# Patient Record
Sex: Male | Born: 1935 | Race: White | Hispanic: No | Marital: Married
Health system: Southern US, Community
[De-identification: ages and names within clinical notes are randomized; demographics above are authoritative.]

---

## 2003-09-14 ENCOUNTER — Other Ambulatory Visit: Payer: Self-pay

## 2004-05-12 ENCOUNTER — Other Ambulatory Visit: Payer: Self-pay

## 2004-05-13 ENCOUNTER — Inpatient Hospital Stay: Payer: Self-pay | Admitting: Internal Medicine

## 2004-05-18 ENCOUNTER — Ambulatory Visit: Payer: Self-pay | Admitting: Physical Medicine & Rehabilitation

## 2004-05-18 ENCOUNTER — Inpatient Hospital Stay (HOSPITAL_COMMUNITY)
Admission: RE | Admit: 2004-05-18 | Discharge: 2004-05-30 | Payer: Self-pay | Admitting: Physical Medicine & Rehabilitation

## 2004-06-26 ENCOUNTER — Encounter: Payer: Self-pay | Admitting: Internal Medicine

## 2004-06-29 ENCOUNTER — Encounter
Admission: RE | Admit: 2004-06-29 | Discharge: 2004-09-27 | Payer: Self-pay | Admitting: Physical Medicine & Rehabilitation

## 2004-07-01 ENCOUNTER — Encounter: Payer: Self-pay | Admitting: Internal Medicine

## 2004-07-03 ENCOUNTER — Ambulatory Visit: Payer: Self-pay | Admitting: Physical Medicine & Rehabilitation

## 2004-07-31 ENCOUNTER — Encounter: Payer: Self-pay | Admitting: Internal Medicine

## 2004-08-31 ENCOUNTER — Encounter: Payer: Self-pay | Admitting: Internal Medicine

## 2004-10-07 ENCOUNTER — Emergency Department: Payer: Self-pay | Admitting: Emergency Medicine

## 2004-10-07 ENCOUNTER — Other Ambulatory Visit: Payer: Self-pay

## 2004-10-18 ENCOUNTER — Encounter: Payer: Self-pay | Admitting: Internal Medicine

## 2005-06-24 ENCOUNTER — Emergency Department: Payer: Self-pay | Admitting: Emergency Medicine

## 2005-11-13 ENCOUNTER — Ambulatory Visit: Payer: Self-pay | Admitting: Urology

## 2008-06-17 ENCOUNTER — Ambulatory Visit: Payer: Self-pay | Admitting: Ophthalmology

## 2008-06-28 ENCOUNTER — Ambulatory Visit: Payer: Self-pay | Admitting: Ophthalmology

## 2010-03-02 ENCOUNTER — Ambulatory Visit: Payer: Self-pay | Admitting: Internal Medicine

## 2010-03-07 ENCOUNTER — Inpatient Hospital Stay: Payer: Self-pay | Admitting: Internal Medicine

## 2010-03-14 ENCOUNTER — Encounter: Payer: Self-pay | Admitting: Internal Medicine

## 2010-04-02 ENCOUNTER — Ambulatory Visit: Payer: Self-pay | Admitting: Internal Medicine

## 2010-04-26 ENCOUNTER — Emergency Department: Payer: Self-pay | Admitting: Urology

## 2010-05-12 ENCOUNTER — Ambulatory Visit (HOSPITAL_BASED_OUTPATIENT_CLINIC_OR_DEPARTMENT_OTHER)
Admission: RE | Admit: 2010-05-12 | Discharge: 2010-05-13 | Disposition: A | Discharge status: Home / Self Care | Payer: Medicare Other | Source: Ambulatory Visit | Attending: Urology | Admitting: Urology

## 2010-05-12 ENCOUNTER — Other Ambulatory Visit: Payer: Self-pay | Admitting: Urology

## 2010-05-12 DIAGNOSIS — Z79899 Other long term (current) drug therapy: Secondary | ICD-10-CM | POA: Insufficient documentation

## 2010-05-12 DIAGNOSIS — N401 Enlarged prostate with lower urinary tract symptoms: Secondary | ICD-10-CM | POA: Insufficient documentation

## 2010-05-12 DIAGNOSIS — N320 Bladder-neck obstruction: Secondary | ICD-10-CM | POA: Insufficient documentation

## 2010-05-12 DIAGNOSIS — R310 Gross hematuria: Secondary | ICD-10-CM | POA: Insufficient documentation

## 2010-05-12 DIAGNOSIS — R339 Retention of urine, unspecified: Secondary | ICD-10-CM | POA: Insufficient documentation

## 2010-05-12 DIAGNOSIS — N138 Other obstructive and reflux uropathy: Secondary | ICD-10-CM | POA: Insufficient documentation

## 2010-05-12 DIAGNOSIS — Z01812 Encounter for preprocedural laboratory examination: Secondary | ICD-10-CM | POA: Insufficient documentation

## 2010-05-12 LAB — COMPREHENSIVE METABOLIC PANEL
BUN: 7 mg/dL (ref 6–23)
Chloride: 108 mEq/L (ref 96–112)
Creatinine, Ser: 0.77 mg/dL (ref 0.4–1.5)
GFR calc Af Amer: >60 mL/min (ref >60)
GFR calc non Af Amer: >60 mL/min (ref >60)
Glucose, Bld: 123 mg/dL — ABNORMAL HIGH (ref 70–99)
Potassium: 3.8 mEq/L (ref 3.5–5.1)
Sodium: 143 mEq/L (ref 135–145)

## 2010-05-12 LAB — URINALYSIS, ROUTINE W REFLEX MICROSCOPIC
Bilirubin Urine: NEGATIVE
Nitrite: NEGATIVE
Protein, ur: 100 mg/dL — AB
Specific Gravity, Urine: 1.018 (ref 1.005–1.030)
Urine Glucose, Fasting: NEGATIVE mg/dL
pH: 8 (ref 5.0–8.0)

## 2010-05-12 LAB — CBC
HCT: 39.5 % (ref 39.0–52.0)
Platelets: 145 10*3/uL — ABNORMAL LOW (ref 150–400)
RDW: 15.8 % — ABNORMAL HIGH (ref 11.5–15.5)
WBC: 6.6 10*3/uL (ref 4.0–10.5)

## 2010-05-12 LAB — URINE MICROSCOPIC-ADD ON

## 2010-05-13 ENCOUNTER — Emergency Department (HOSPITAL_COMMUNITY)
Admission: EM | Admit: 2010-05-13 | Discharge: 2010-05-13 | Disposition: A | Discharge status: Home / Self Care | Payer: Medicare Other | Attending: Emergency Medicine | Admitting: Emergency Medicine

## 2010-05-13 DIAGNOSIS — I10 Essential (primary) hypertension: Secondary | ICD-10-CM | POA: Insufficient documentation

## 2010-05-13 DIAGNOSIS — R339 Retention of urine, unspecified: Secondary | ICD-10-CM | POA: Insufficient documentation

## 2010-05-13 DIAGNOSIS — Z9889 Other specified postprocedural states: Secondary | ICD-10-CM | POA: Insufficient documentation

## 2010-05-13 DIAGNOSIS — N390 Urinary tract infection, site not specified: Secondary | ICD-10-CM | POA: Insufficient documentation

## 2010-05-13 LAB — URINALYSIS, ROUTINE W REFLEX MICROSCOPIC
Bilirubin Urine: NEGATIVE
Ketones, ur: NEGATIVE mg/dL
Nitrite: NEGATIVE
Protein, ur: 100 mg/dL — AB
Specific Gravity, Urine: 1.01 (ref 1.005–1.030)

## 2010-05-13 LAB — URINE MICROSCOPIC-ADD ON

## 2010-05-15 LAB — URINE CULTURE
Colony Count: >=100000
Culture  Setup Time: 201202101405
Culture  Setup Time: 201202120153
Culture: NO GROWTH

## 2010-05-23 NOTE — Op Note (Signed)
  Derek, [name]                ACCOUNT NO.:  [id number]  MEDICAL RECORD NO.:  [id number]           PATIENT TYPE:  LOCATION:                                 FACILITY:  PHYSICIAN:  Danae Chen, M.D.       DATE OF BIRTH:  DATE OF PROCEDURE:  05/12/2010 DATE OF DISCHARGE:                              OPERATIVE REPORT   PREOPERATIVE DIAGNOSES: 1. Acute urinary retention. 2. Benign prostatic hypertrophy.  POSTOPERATIVE DIAGNOSES: 1. Acute urinary retention. 2. Benign prostatic hypertrophy.  PROCEDURES DONE: 1. Cystoscopy. 2. Transurethral resection of prostate.  SURGEON:  Danae Chen, MD  ANESTHESIA:  General.  INDICATIONS:  The patient is an 77 year old male who went into urinary retention in December 2011.  He also had gross hematuria.  A Foley catheter was left indwelling.  He failed about 5 voiding trials.  He is on tamsulosin.  Cystoscopy showed trilobar prostatic hypertrophy.  CT scan showed normal kidneys and thickened bladder wall.  The patient is scheduled today for cystoscopy and TURP.  DESCRIPTION OF PROCEDURE:  The patient was identified by his wristband, and proper time-out was taken.  Under general anesthesia, he was prepped and draped and placed in the dorsal lithotomy position.  A panendoscope was inserted in the bladder. The anterior urethra is normal.  He has trilobar prostatic hypertrophy with obstruction of the bladder neck.  The bladder is trabeculated. There is a diverticulum on the posterior wall of the bladder.  There is no stone or tumor in the bladder nor in the diverticulum.  The ureteral orifices are in normal position and shape.  The cystoscope was removed. The urethra was dilated up to #30- [place], then a #28 resectoscope was inserted in the bladder.  Resection of the prostate was started between the 5 and the 7 o'clock position using the bladder neck and the verumontanum as landmarks.  Then, resection was done between the 7 and 10  o'clock positions and between the 2 and 5 o'clock positions using the same landmarks.  The resection was then completed between the 10 and the 2 o'clock positions.  Hemostasis was secured with electrocautery.  There was minimal bleeding at the end of the procedure.  The prostatic chips were irrigated out of the bladder. Then, the resectoscope was removed, and a #24 Foley catheter was inserted in the bladder.  The patient tolerated the procedure well and left the OR in satisfactory condition to postanesthesia care unit.     Danae Chen, M.D.     MN/MEDQ  D:  05/12/2010  T:  05/12/2010  Job:  [id number]  Electronically Signed by Lindaann Slough M.D. on 05/23/2010 11:35:29 AM

## 2010-07-19 ENCOUNTER — Encounter: Payer: Self-pay | Admitting: Internal Medicine

## 2010-08-01 ENCOUNTER — Encounter: Payer: Self-pay | Admitting: Internal Medicine

## 2010-09-01 ENCOUNTER — Encounter: Payer: Self-pay | Admitting: Internal Medicine

## 2011-06-07 ENCOUNTER — Encounter: Payer: Self-pay | Admitting: Internal Medicine

## 2011-07-02 ENCOUNTER — Encounter: Payer: Self-pay | Admitting: Internal Medicine

## 2011-08-01 ENCOUNTER — Encounter: Payer: Self-pay | Admitting: Internal Medicine

## 2012-06-23 ENCOUNTER — Inpatient Hospital Stay: Payer: Self-pay | Admitting: Orthopedic Surgery

## 2012-06-23 LAB — COMPREHENSIVE METABOLIC PANEL
Alkaline Phosphatase: 112 U/L (ref 50–136)
Anion Gap: 2 — ABNORMAL LOW (ref 7–16)
Bilirubin,Total: 1.3 mg/dL — ABNORMAL HIGH (ref 0.2–1.0)
Co2: 33 mmol/L — ABNORMAL HIGH (ref 21–32)
Creatinine: 0.95 mg/dL (ref 0.60–1.30)
EGFR (Non-African Amer.): >60
Potassium: 3.8 mmol/L (ref 3.5–5.1)

## 2012-06-23 LAB — CBC WITH DIFFERENTIAL/PLATELET
Basophil %: 0.3 %
Eosinophil #: 0 10*3/uL (ref 0.0–0.7)
Eosinophil %: 0.3 %
HCT: 49.3 % (ref 40.0–52.0)
Lymphocyte #: 2.4 10*3/uL (ref 1.0–3.6)
MCHC: 33.5 g/dL (ref 32.0–36.0)
Monocyte #: 1 x10 3/mm (ref 0.2–1.0)
Monocyte %: 8.5 %
Neutrophil #: 7.9 10*3/uL — ABNORMAL HIGH (ref 1.4–6.5)
RBC: 5.02 10*6/uL (ref 4.40–5.90)
RDW: 15.2 % — ABNORMAL HIGH (ref 11.5–14.5)
WBC: 11.4 10*3/uL — ABNORMAL HIGH (ref 3.8–10.6)

## 2012-06-23 LAB — PROTIME-INR
INR: 2.2
Prothrombin Time: 24.2 secs — ABNORMAL HIGH (ref 11.5–14.7)

## 2012-06-24 LAB — PROTIME-INR
INR: 1.4
Prothrombin Time: 17.1 secs — ABNORMAL HIGH (ref 11.5–14.7)

## 2012-06-24 LAB — BASIC METABOLIC PANEL
Anion Gap: 4 — ABNORMAL LOW (ref 7–16)
Chloride: 108 mmol/L — ABNORMAL HIGH (ref 98–107)
Creatinine: 0.92 mg/dL (ref 0.60–1.30)
EGFR (African American): >60
Glucose: 150 mg/dL — ABNORMAL HIGH (ref 65–99)

## 2012-06-24 LAB — CBC WITH DIFFERENTIAL/PLATELET
Basophil #: 0 10*3/uL (ref 0.0–0.1)
Basophil %: 0.3 %
HGB: 13.8 g/dL (ref 13.0–18.0)
Lymphocyte #: 1.9 10*3/uL (ref 1.0–3.6)
Lymphocyte %: 19 %
Monocyte %: 8.1 %
Neutrophil #: 7.2 10*3/uL — ABNORMAL HIGH (ref 1.4–6.5)
Neutrophil %: 71.2 %
WBC: 10.2 10*3/uL (ref 3.8–10.6)

## 2012-06-24 LAB — MAGNESIUM: Magnesium: 1.8 mg/dL

## 2012-06-25 LAB — PROTIME-INR: Prothrombin Time: 16.1 secs — ABNORMAL HIGH (ref 11.5–14.7)

## 2012-06-26 LAB — BASIC METABOLIC PANEL
Anion Gap: 4 — ABNORMAL LOW (ref 7–16)
Chloride: 104 mmol/L (ref 98–107)
Co2: 31 mmol/L (ref 21–32)
EGFR (African American): >60

## 2012-06-26 LAB — CBC WITH DIFFERENTIAL/PLATELET
Basophil #: 0 10*3/uL (ref 0.0–0.1)
Eosinophil #: 0.5 10*3/uL (ref 0.0–0.7)
Eosinophil %: 4.7 %
HGB: 12 g/dL — ABNORMAL LOW (ref 13.0–18.0)
Lymphocyte #: 1.7 10*3/uL (ref 1.0–3.6)
MCH: 32.9 pg (ref 26.0–34.0)
MCHC: 33.3 g/dL (ref 32.0–36.0)
MCV: 99 fL (ref 80–100)
Monocyte #: 1.6 x10 3/mm — ABNORMAL HIGH (ref 0.2–1.0)
Monocyte %: 15.8 %
Neutrophil %: 62.6 %
RBC: 3.65 10*6/uL — ABNORMAL LOW (ref 4.40–5.90)
RDW: 14.6 % — ABNORMAL HIGH (ref 11.5–14.5)

## 2012-06-27 LAB — BASIC METABOLIC PANEL
Creatinine: 1.04 mg/dL (ref 0.60–1.30)
Osmolality: 285 (ref 275–301)
Potassium: 3.9 mmol/L (ref 3.5–5.1)
Sodium: 141 mmol/L (ref 136–145)

## 2012-06-28 LAB — URINALYSIS, COMPLETE
Bacteria: NONE SEEN
Bilirubin,UR: NEGATIVE
Glucose,UR: NEGATIVE mg/dL (ref 0–75)
Hyaline Cast: 1
Leukocyte Esterase: NEGATIVE
RBC,UR: 2 /HPF (ref 0–5)
Specific Gravity: 1.017 (ref 1.003–1.030)

## 2012-06-28 LAB — BASIC METABOLIC PANEL
Anion Gap: 4 — ABNORMAL LOW (ref 7–16)
Calcium, Total: 8.5 mg/dL (ref 8.5–10.1)
Creatinine: 1.13 mg/dL (ref 0.60–1.30)
EGFR (Non-African Amer.): 59 — ABNORMAL LOW
Potassium: 4 mmol/L (ref 3.5–5.1)

## 2012-06-28 LAB — CBC WITH DIFFERENTIAL/PLATELET
HGB: 11.3 g/dL — ABNORMAL LOW (ref 13.0–18.0)
MCH: 33.8 pg (ref 26.0–34.0)
MCHC: 34 g/dL (ref 32.0–36.0)
Monocyte #: 1.5 x10 3/mm — ABNORMAL HIGH (ref 0.2–1.0)
Monocyte %: 15 %
WBC: 9.7 10*3/uL (ref 3.8–10.6)

## 2012-06-29 LAB — BASIC METABOLIC PANEL
BUN: 25 mg/dL — ABNORMAL HIGH (ref 7–18)
Calcium, Total: 8.1 mg/dL — ABNORMAL LOW (ref 8.5–10.1)
Co2: 32 mmol/L (ref 21–32)
EGFR (Non-African Amer.): >60
Potassium: 3.4 mmol/L — ABNORMAL LOW (ref 3.5–5.1)
Sodium: 144 mmol/L (ref 136–145)

## 2012-06-30 ENCOUNTER — Encounter: Payer: Self-pay | Admitting: Internal Medicine

## 2012-07-01 ENCOUNTER — Encounter: Payer: Self-pay | Admitting: Internal Medicine

## 2012-07-03 LAB — PROTIME-INR
INR: 1.2
Prothrombin Time: 15.1 secs — ABNORMAL HIGH (ref 11.5–14.7)

## 2012-07-15 LAB — PROTIME-INR
INR: 2.3
Prothrombin Time: 24.6 secs — ABNORMAL HIGH (ref 11.5–14.7)

## 2012-07-17 LAB — PROTIME-INR: Prothrombin Time: 21.2 secs — ABNORMAL HIGH (ref 11.5–14.7)

## 2012-07-22 LAB — PROTIME-INR: INR: 3

## 2012-07-29 LAB — PROTIME-INR: Prothrombin Time: 35.1 secs — ABNORMAL HIGH (ref 11.5–14.7)

## 2012-07-31 ENCOUNTER — Encounter: Payer: Self-pay | Admitting: Internal Medicine

## 2013-08-31 ENCOUNTER — Encounter: Payer: Self-pay | Admitting: Internal Medicine

## 2013-09-16 ENCOUNTER — Inpatient Hospital Stay: Payer: Self-pay | Admitting: Specialist

## 2013-09-16 LAB — COMPREHENSIVE METABOLIC PANEL
ALK PHOS: 110 U/L
ANION GAP: 6 — AB (ref 7–16)
Albumin: 3.6 g/dL (ref 3.4–5.0)
BILIRUBIN TOTAL: 1.2 mg/dL — AB (ref 0.2–1.0)
BUN: 16 mg/dL (ref 7–18)
CO2: 31 mmol/L (ref 21–32)
Calcium, Total: 8.9 mg/dL (ref 8.5–10.1)
Chloride: 104 mmol/L (ref 98–107)
Creatinine: 0.93 mg/dL (ref 0.60–1.30)
EGFR (Non-African Amer.): >60
Glucose: 109 mg/dL — ABNORMAL HIGH (ref 65–99)
Osmolality: 283 (ref 275–301)
Potassium: 3.9 mmol/L (ref 3.5–5.1)
SGOT(AST): 45 U/L — ABNORMAL HIGH (ref 15–37)
SGPT (ALT): 22 U/L (ref 12–78)
Sodium: 141 mmol/L (ref 136–145)
TOTAL PROTEIN: 7 g/dL (ref 6.4–8.2)

## 2013-09-16 LAB — URINALYSIS, COMPLETE
BACTERIA: NONE SEEN
Bilirubin,UR: NEGATIVE
GLUCOSE, UR: NEGATIVE mg/dL (ref 0–75)
Ketone: NEGATIVE
Leukocyte Esterase: NEGATIVE
Nitrite: NEGATIVE
PH: 6 (ref 4.5–8.0)
PROTEIN: NEGATIVE
RBC,UR: 1 /HPF (ref 0–5)
SPECIFIC GRAVITY: 1.006 (ref 1.003–1.030)
Squamous Epithelial: NONE SEEN

## 2013-09-16 LAB — PROTIME-INR
INR: 2.3
PROTHROMBIN TIME: 25 s — AB (ref 11.5–14.7)

## 2013-09-16 LAB — CBC
HCT: 43.2 % (ref 40.0–52.0)
HGB: 14.3 g/dL (ref 13.0–18.0)
MCH: 33.3 pg (ref 26.0–34.0)
MCHC: 33.2 g/dL (ref 32.0–36.0)
MCV: 100 fL (ref 80–100)
PLATELETS: 139 10*3/uL — AB (ref 150–440)
RBC: 4.31 10*6/uL — ABNORMAL LOW (ref 4.40–5.90)
RDW: 15.6 % — AB (ref 11.5–14.5)
WBC: 9.1 10*3/uL (ref 3.8–10.6)

## 2013-09-17 LAB — BASIC METABOLIC PANEL
Anion Gap: 4 — ABNORMAL LOW (ref 7–16)
BUN: 16 mg/dL (ref 7–18)
CREATININE: 0.91 mg/dL (ref 0.60–1.30)
Calcium, Total: 8.8 mg/dL (ref 8.5–10.1)
Chloride: 104 mmol/L (ref 98–107)
Co2: 34 mmol/L — ABNORMAL HIGH (ref 21–32)
EGFR (African American): >60
GLUCOSE: 131 mg/dL — AB (ref 65–99)
Osmolality: 286 (ref 275–301)
POTASSIUM: 4.4 mmol/L (ref 3.5–5.1)
Sodium: 142 mmol/L (ref 136–145)

## 2013-09-17 LAB — CBC WITH DIFFERENTIAL/PLATELET
BASOS ABS: 0 10*3/uL (ref 0.0–0.1)
Basophil %: 0.4 %
Eosinophil #: 0 10*3/uL (ref 0.0–0.7)
Eosinophil %: 0.5 %
HCT: 37.1 % — AB (ref 40.0–52.0)
HGB: 12.4 g/dL — ABNORMAL LOW (ref 13.0–18.0)
Lymphocyte #: 2.3 10*3/uL (ref 1.0–3.6)
Lymphocyte %: 24.6 %
MCH: 33.4 pg (ref 26.0–34.0)
MCHC: 33.6 g/dL (ref 32.0–36.0)
MCV: 100 fL (ref 80–100)
MONO ABS: 1.3 x10 3/mm — AB (ref 0.2–1.0)
MONOS PCT: 14.3 %
Neutrophil #: 5.6 10*3/uL (ref 1.4–6.5)
Neutrophil %: 60.2 %
Platelet: 135 10*3/uL — ABNORMAL LOW (ref 150–440)
RBC: 3.73 10*6/uL — ABNORMAL LOW (ref 4.40–5.90)
RDW: 15.8 % — ABNORMAL HIGH (ref 11.5–14.5)
WBC: 9.3 10*3/uL (ref 3.8–10.6)

## 2013-09-17 LAB — PROTIME-INR
INR: 1.6
Prothrombin Time: 18.5 secs — ABNORMAL HIGH (ref 11.5–14.7)

## 2013-09-18 LAB — BASIC METABOLIC PANEL
ANION GAP: 4 — AB (ref 7–16)
BUN: 17 mg/dL (ref 7–18)
Calcium, Total: 8.9 mg/dL (ref 8.5–10.1)
Chloride: 105 mmol/L (ref 98–107)
Co2: 32 mmol/L (ref 21–32)
Creatinine: 1.01 mg/dL (ref 0.60–1.30)
EGFR (African American): >60
Glucose: 115 mg/dL — ABNORMAL HIGH (ref 65–99)
Osmolality: 284 (ref 275–301)
POTASSIUM: 3.6 mmol/L (ref 3.5–5.1)
SODIUM: 141 mmol/L (ref 136–145)

## 2013-09-18 LAB — CBC WITH DIFFERENTIAL/PLATELET
BASOS ABS: 0 10*3/uL (ref 0.0–0.1)
Basophil %: 0.4 %
Eosinophil #: 0.4 10*3/uL (ref 0.0–0.7)
Eosinophil %: 5 %
HCT: 34.7 % — AB (ref 40.0–52.0)
HGB: 11.5 g/dL — ABNORMAL LOW (ref 13.0–18.0)
LYMPHS ABS: 2.2 10*3/uL (ref 1.0–3.6)
LYMPHS PCT: 25.2 %
MCH: 33.3 pg (ref 26.0–34.0)
MCHC: 33 g/dL (ref 32.0–36.0)
MCV: 101 fL — ABNORMAL HIGH (ref 80–100)
Monocyte #: 1.4 x10 3/mm — ABNORMAL HIGH (ref 0.2–1.0)
Monocyte %: 15.8 %
Neutrophil #: 4.8 10*3/uL (ref 1.4–6.5)
Neutrophil %: 53.6 %
PLATELETS: 113 10*3/uL — AB (ref 150–440)
RBC: 3.44 10*6/uL — ABNORMAL LOW (ref 4.40–5.90)
RDW: 16.1 % — ABNORMAL HIGH (ref 11.5–14.5)
WBC: 8.9 10*3/uL (ref 3.8–10.6)

## 2013-09-18 LAB — PROTIME-INR
INR: 1.2
Prothrombin Time: 15.4 secs — ABNORMAL HIGH (ref 11.5–14.7)

## 2013-09-19 LAB — BASIC METABOLIC PANEL
ANION GAP: 3 — AB (ref 7–16)
BUN: 13 mg/dL (ref 7–18)
Calcium, Total: 8.2 mg/dL — ABNORMAL LOW (ref 8.5–10.1)
Chloride: 104 mmol/L (ref 98–107)
Co2: 33 mmol/L — ABNORMAL HIGH (ref 21–32)
Creatinine: 0.95 mg/dL (ref 0.60–1.30)
EGFR (African American): >60
EGFR (Non-African Amer.): >60
Glucose: 164 mg/dL — ABNORMAL HIGH (ref 65–99)
Osmolality: 283 (ref 275–301)
POTASSIUM: 3.3 mmol/L — AB (ref 3.5–5.1)
Sodium: 140 mmol/L (ref 136–145)

## 2013-09-19 LAB — CBC WITH DIFFERENTIAL/PLATELET
BASOS PCT: 0.6 %
Basophil #: 0.1 10*3/uL (ref 0.0–0.1)
EOS PCT: 2.4 %
Eosinophil #: 0.2 10*3/uL (ref 0.0–0.7)
HCT: 28.3 % — AB (ref 40.0–52.0)
HGB: 9.6 g/dL — AB (ref 13.0–18.0)
Lymphocyte #: 1.5 10*3/uL (ref 1.0–3.6)
Lymphocyte %: 16.8 %
MCH: 33.8 pg (ref 26.0–34.0)
MCHC: 34.1 g/dL (ref 32.0–36.0)
MCV: 99 fL (ref 80–100)
MONOS PCT: 17.4 %
Monocyte #: 1.6 x10 3/mm — ABNORMAL HIGH (ref 0.2–1.0)
NEUTROS PCT: 62.8 %
Neutrophil #: 5.8 10*3/uL (ref 1.4–6.5)
Platelet: 114 10*3/uL — ABNORMAL LOW (ref 150–440)
RBC: 2.85 10*6/uL — ABNORMAL LOW (ref 4.40–5.90)
RDW: 15.1 % — ABNORMAL HIGH (ref 11.5–14.5)
WBC: 9.2 10*3/uL (ref 3.8–10.6)

## 2013-09-19 LAB — PROTIME-INR
INR: 1.4
Prothrombin Time: 16.7 secs — ABNORMAL HIGH (ref 11.5–14.7)

## 2013-09-20 LAB — CBC WITH DIFFERENTIAL/PLATELET
Comment - H1-Com2: NORMAL
HCT: 26.6 % — ABNORMAL LOW (ref 40.0–52.0)
HGB: 8.8 g/dL — ABNORMAL LOW (ref 13.0–18.0)
LYMPHS PCT: 21 %
MCH: 32.9 pg (ref 26.0–34.0)
MCHC: 33 g/dL (ref 32.0–36.0)
MCV: 100 fL (ref 80–100)
Monocytes: 9 %
PLATELETS: 108 10*3/uL — AB (ref 150–440)
RBC: 2.66 10*6/uL — AB (ref 4.40–5.90)
RDW: 15.1 % — AB (ref 11.5–14.5)
Segmented Neutrophils: 70 %
WBC: 10.9 10*3/uL — ABNORMAL HIGH (ref 3.8–10.6)

## 2013-09-20 LAB — BASIC METABOLIC PANEL
Anion Gap: 6 — ABNORMAL LOW (ref 7–16)
BUN: 18 mg/dL (ref 7–18)
CALCIUM: 8.3 mg/dL — AB (ref 8.5–10.1)
CO2: 32 mmol/L (ref 21–32)
Chloride: 103 mmol/L (ref 98–107)
Creatinine: 1.23 mg/dL (ref 0.60–1.30)
EGFR (African American): >60
GFR CALC NON AF AMER: 52 — AB
Glucose: 122 mg/dL — ABNORMAL HIGH (ref 65–99)
OSMOLALITY: 284 (ref 275–301)
Potassium: 3.2 mmol/L — ABNORMAL LOW (ref 3.5–5.1)
SODIUM: 141 mmol/L (ref 136–145)

## 2013-09-20 LAB — PROTIME-INR
INR: 1.7
Prothrombin Time: 19.6 secs — ABNORMAL HIGH (ref 11.5–14.7)

## 2013-09-21 LAB — BASIC METABOLIC PANEL
Anion Gap: 6 — ABNORMAL LOW (ref 7–16)
BUN: 18 mg/dL (ref 7–18)
Calcium, Total: 8.1 mg/dL — ABNORMAL LOW (ref 8.5–10.1)
Chloride: 102 mmol/L (ref 98–107)
Co2: 34 mmol/L — ABNORMAL HIGH (ref 21–32)
Creatinine: 1.06 mg/dL (ref 0.60–1.30)
EGFR (African American): >60
EGFR (Non-African Amer.): >60
Glucose: 134 mg/dL — ABNORMAL HIGH (ref 65–99)
OSMOLALITY: 287 (ref 275–301)
Potassium: 3.3 mmol/L — ABNORMAL LOW (ref 3.5–5.1)
SODIUM: 142 mmol/L (ref 136–145)

## 2013-09-21 LAB — PROTIME-INR
INR: 2
PROTHROMBIN TIME: 22.3 s — AB (ref 11.5–14.7)

## 2013-09-21 LAB — HEMOGLOBIN: HGB: 8.7 g/dL — ABNORMAL LOW (ref 13.0–18.0)

## 2013-09-22 ENCOUNTER — Encounter: Payer: Self-pay | Admitting: Internal Medicine

## 2013-09-22 LAB — BASIC METABOLIC PANEL
Anion Gap: 5 — ABNORMAL LOW (ref 7–16)
BUN: 23 mg/dL — AB (ref 7–18)
CALCIUM: 9.3 mg/dL (ref 8.5–10.1)
Chloride: 105 mmol/L (ref 98–107)
Co2: 34 mmol/L — ABNORMAL HIGH (ref 21–32)
Creatinine: 1 mg/dL (ref 0.60–1.30)
EGFR (African American): >60
GLUCOSE: 118 mg/dL — AB (ref 65–99)
Osmolality: 292 (ref 275–301)
Potassium: 3.8 mmol/L (ref 3.5–5.1)
SODIUM: 144 mmol/L (ref 136–145)

## 2013-09-22 LAB — PROTIME-INR
INR: 2.2
Prothrombin Time: 23.5 secs — ABNORMAL HIGH (ref 11.5–14.7)

## 2013-09-22 LAB — HEMOGLOBIN: HGB: 8.9 g/dL — ABNORMAL LOW (ref 13.0–18.0)

## 2013-09-24 LAB — PROTIME-INR
INR: 3.2
Prothrombin Time: 31.6 secs — ABNORMAL HIGH (ref 11.5–14.7)

## 2013-09-30 ENCOUNTER — Encounter: Payer: Self-pay | Admitting: Internal Medicine

## 2013-09-30 ENCOUNTER — Ambulatory Visit
Admit: 2013-09-30 | Disposition: A | Discharge status: Home / Self Care | Payer: Self-pay | Attending: Nurse Practitioner | Admitting: Nurse Practitioner

## 2013-10-01 LAB — CBC WITH DIFFERENTIAL/PLATELET
BASOS ABS: 0.1 10*3/uL (ref 0.0–0.1)
BASOS PCT: 0.8 %
EOS ABS: 0.1 10*3/uL (ref 0.0–0.7)
Eosinophil %: 1.4 %
HCT: 31.4 % — ABNORMAL LOW (ref 40.0–52.0)
HGB: 10.5 g/dL — ABNORMAL LOW (ref 13.0–18.0)
LYMPHS PCT: 23.7 %
Lymphocyte #: 1.9 10*3/uL (ref 1.0–3.6)
MCH: 34.4 pg — ABNORMAL HIGH (ref 26.0–34.0)
MCHC: 33.3 g/dL (ref 32.0–36.0)
MCV: 103 fL — ABNORMAL HIGH (ref 80–100)
Monocyte #: 0.7 x10 3/mm (ref 0.2–1.0)
Monocyte %: 9.1 %
NEUTROS PCT: 65 %
Neutrophil #: 5.3 10*3/uL (ref 1.4–6.5)
PLATELETS: 309 10*3/uL (ref 150–440)
RBC: 3.04 10*6/uL — ABNORMAL LOW (ref 4.40–5.90)
RDW: 16.3 % — ABNORMAL HIGH (ref 11.5–14.5)
WBC: 8.2 10*3/uL (ref 3.8–10.6)

## 2013-10-01 LAB — COMPREHENSIVE METABOLIC PANEL
ALT: 16 U/L (ref 12–78)
Albumin: 2.5 g/dL — ABNORMAL LOW (ref 3.4–5.0)
Alkaline Phosphatase: 97 U/L
Anion Gap: 4 — ABNORMAL LOW (ref 7–16)
BILIRUBIN TOTAL: 1.2 mg/dL — AB (ref 0.2–1.0)
BUN: 20 mg/dL — ABNORMAL HIGH (ref 7–18)
CREATININE: 0.96 mg/dL (ref 0.60–1.30)
Calcium, Total: 8.9 mg/dL (ref 8.5–10.1)
Chloride: 110 mmol/L — ABNORMAL HIGH (ref 98–107)
Co2: 31 mmol/L (ref 21–32)
EGFR (African American): >60
EGFR (Non-African Amer.): >60
Glucose: 121 mg/dL — ABNORMAL HIGH (ref 65–99)
Osmolality: 293 (ref 275–301)
Potassium: 3.5 mmol/L (ref 3.5–5.1)
SGOT(AST): 17 U/L (ref 15–37)
Sodium: 145 mmol/L (ref 136–145)
TOTAL PROTEIN: 5.5 g/dL — AB (ref 6.4–8.2)

## 2013-10-01 LAB — MAGNESIUM: Magnesium: 1.6 mg/dL — ABNORMAL LOW

## 2013-10-01 LAB — TSH: THYROID STIMULATING HORM: 0.967 u[IU]/mL

## 2013-10-01 LAB — PROTIME-INR
INR: 2.5
PROTHROMBIN TIME: 26.1 s — AB (ref 11.5–14.7)

## 2013-10-08 LAB — PROTIME-INR
INR: 2.1
Prothrombin Time: 23.1 secs — ABNORMAL HIGH (ref 11.5–14.7)

## 2013-10-15 LAB — PROTIME-INR
INR: 3
PROTHROMBIN TIME: 30.1 s — AB (ref 11.5–14.7)

## 2013-10-20 LAB — PROTIME-INR
INR: 3.3
Prothrombin Time: 32.8 secs — ABNORMAL HIGH (ref 11.5–14.7)

## 2013-10-28 LAB — PROTIME-INR
INR: 3.9
Prothrombin Time: 37.3 secs — ABNORMAL HIGH (ref 11.5–14.7)

## 2013-10-31 ENCOUNTER — Encounter: Payer: Self-pay | Admitting: Internal Medicine

## 2014-03-02 DEATH — deceased

## 2014-07-23 NOTE — H&P (Signed)
PATIENT NAME:  Derek [name], CALVILLO MR#:  [id number] DATE OF BIRTH:  09-30-1923  DATE OF ADMISSION:  06/23/2012  ED REFERRING PHYSICIAN:  Maricela Bo, MD   PRIMARY CARE PHYSICIAN: Stann Mainland. Sampson Goon, MD  REFERRING ORTHOPEDIST:  Murlean Hark, MD   ADMITTING PHYSICIAN: Stephanie Acre, MD   CHIEF COMPLAINT: "I fell today."   HISTORY OF PRESENT ILLNESS: This is an 81 year old Caucasian male with past medical history of hypertension, diabetes, atrial fibrillation on Coumadin, who had an accidental fall today. History is per the patient and per the wife. The patient is hard of hearing in his left ear. He also has some left-sided weakness which he says is nothing new for him. The patient states today he came home and he was walking in the den area with his walker when his left foot got caught on the walker, and he had an accidental fall. His wife, who was in the kitchen, heard him fall and calling out for her.  When she got to where he was at, she noticed that he was lying on his left side.  The walker was caught around his foot. She immediately call EMS, who brought him to the ED.  In the ED, he was noted to have a hip series done that showed he had a left hip fracture. Dr. Claris Gladden was consulted as the orthopedic surgeon on call.  She stated that because the patient is currently on Coumadin and has a history of atrial fibrillation, she would like medical clearance and cardiac clearance prior to surgery. His INR was noted to be therapeutic at 2.2 for his Coumadin, and she requested that his INR be less than 1.5. Hospitalist services were consulted for further inpatient work-up and management.    HOME MEDICATIONS:  1. Coumadin 5 mg daily.  2. Aspirin 81 mg daily.  3. Klor-Con 10 mEq 2 tabs b.i.d.  4. Lisinopril 10 mg once a day.  5. Lyrica 150 mg 1 tab b.i.d.  6. Magnesium oxide 1 tab once a day. 7. Multivitamin daily. 8. Torsemide 10 mg daily.  PAST SURGICAL HISTORY: Surgery on the left ear.   ALLERGIES: No known drug allergies.   FAMILY HISTORY: Positive for heart disease and diabetes. Mother had lung cancer. His father died at the age of 48 due to heart failure.  His brother has a pacemaker. He has 3 children, and his wife is alive and healthy.   SOCIAL HISTORY: Nonsmoker, nondrinker. 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 He is still active but difficulty moving much due to back issues and uses a walker.   REVIEW OF SYSTEMS:   CONSTITUTIONAL: No fever, fatigue, weakness. Positive left hip pain. No weight gain or weight loss.  EYES: No blurred vision, double vision, pain or redness or inflammation.  ENT: No tinnitus. No ear pain. Difficulty hearing in both ears, especially the left. Otherwise, no seasonal allergies, epistaxis, discharge or snoring.  RESPIRATORY: No cough, no wheeze, no hemoptysis. No dyspnea. No asthma.  No painful respirations or COPD.  CARDIOVASCULAR: No chest pain, no orthopnea. No edema. No arrhythmia or dyspnea on exertion. No palpitations, syncope. Positive high blood pressure.  GASTROINTESTINAL: No nausea, vomiting, diarrhea, abdominal pain, hematemesis, melena, ulcers. Does have a history of constipation.  GENITOURINARY: No dysuria, hematuria or renal calculi.  ENDOCRINE: No polyuria, nocturia or thyroid problems.  HEMATOLOGIC/LYMPHATIC: No anemia, easy bruising, bleeding or swollen glands.  INTEGUMENT: No acne. No rash, lesions, change in mole, hair or skin.  MUSCULOSKELETAL: Positive  left hip pain secondary to fall. Otherwise, prior to that no neck, back, shoulder or knee pain. No arthritis.  NEUROLOGIC: Left leg weakness, chronic. Otherwise no numbness, dysarthria, epilepsy, tremor, vertigo, ataxia, dementia, headache.  PSYCHIATRIC: No anxiety, insomnia, ADD, OCD, bipolar or depression.   PHYSICAL EXAMINATION: VITAL SIGNS: While in the ED, blood pressure was 193/97, pulse oximetry of 94%, temperature at 97.9.  GENERAL APPEARANCE: Well-developed,  well-nourished male lying in bed in no acute respiratory distress, in moderate left hip pain.  HEENT: PERRLA. EOMI.  No scleral icterus. No conjunctivitis. Positive difficulty hearing, especially in the left ear. Otherwise, TMs are intact and clear. No pharyngeal erythema. Mucous membranes are moist.  NECK: No thyroid enlargement or nodules. Neck is supple, nontender. No adenopathy is noted. No JVD. No carotid bruits. Full range of motion is noted.  RESPIRATORY: Clear to auscultation bilaterally. No rales, rhonchi, crackles. No diminished breath sounds.  CARDIOVASCULAR: Irregularly irregular.  S1, S2 auscultated. No murmurs. No lower extremity edema.  CHEST: Nontender. Good pedal pulses are noted.  ABDOMEN: Soft, nontender and nondistended. Positive bowel sounds.  MUSCULOSKELETAL: Severe left hip pain, unable to assess muscle strength, but right lower extremity with 4 to 5, bilateral upper extremities with 4 to 5 strength also.  SKIN: No rash, lesions, erythema, nodules. Skin is warm and dry.  LYMPHATIC: No adenopathy noted in cervical, axilla or supraclavicular regions.  NEUROLOGIC: Cranial nerves II through XII are intact. Deep tendon reflexes are intact. No aphasia or dysphagia.  PSYCHIATRIC: Alert and oriented to person, time and place. Cooperative. Good judgment. Memory is intact.   LABORATORY AND RADIOLOGICAL DATA:  Sodium 142, potassium 3.8, chloride 107, bicarbonate is at 33, BUN at 17, creatinine at 0.95, glucose at 132.  His white cell count is 11.4, hemoglobin 16.5, hematocrit 49.3, platelet count at 113.  INR is at 2.2, PTT at 33.0 and PT is 24.2.   He had an EKG done that showed atrial fibrillation, roughly about 94 to 95 beats per minute, PR interval. He had imaging studies done.  Hip series on the left: Findings consistent with subcapital femoral neck fracture with possibly a component of impaction. He had a pelvic series done that showed subcapital femoral neck fracture on the left  with a likely component of impaction. He has a fractured hip.  ASSESSMENT AND PLAN: An 81 year old with past medical history of afib on Coumadin hypertension, diabetes mellitus, now with left hip fracture.   1. Left hip fracture:  Accidental fall, left foot got caught on a walker. He will require surgical intervention.  I spoke with Dr. Claris Gladdenamasunder, who requested medical and cardiac clearance. Plan to reverse INR less than 1.5, currently on Coumadin. Stop Coumadin. Cardiology consult in the morning. Pain control with morphine at this time, 2 to 4 mg q. 4 hours as needed for pain. Further recommendations as far as for weightbearing will wait for orthopedics. The patient is to be n.p.o. IV fluids at normal saline at 75 mL/h.  2. Atrial fibrillation:  This is chronic atrial fibrillation, currently on Coumadin. We will plan to reverse for surgical intervention at the request of the surgeon. She would like it less than 1.5. Will give 2 units of FFP. Consent is put in the chart. Cardiology consult in the morning for preoperative clearance and continue rate control for his atrial fibrillation. The patient [facility].  3. Hypertension:  Continue with Lisinopril and torsemide.  4. Diabetes:  Monitor FBS and  start insulin sliding scale.  5. Gastrointestinal and deep vein thrombosis prophylaxis: Continue with PPI and TEDs.   CODE STATUS:  FULL CODE.    TIME SPENT:   Evaluating the patient took 55 minutes.      ____________________________ Stephanie Acre, MD vm:cb D: 06/23/2012 23:00:31 ET T: 06/23/2012 23:32:16 ET JOB#: [id number]  cc: Stephanie Acre, MD, <Dictator> Stephanie Acre MD ELECTRONICALLY SIGNED 06/24/2012 17:43

## 2014-07-23 NOTE — Consult Note (Signed)
Brief Consult Note: Diagnosis: Pre-op, h/o MI, CVA and AF. Low to moderate risk for CV complication.   Patient was seen by consultant.   Consult note dictated.   Comments: REC  Agree with current therapy, defer further cardiac diagnostics, start metoprolol succ 25 mg QD, proceed with surgery, benefit appears to out weigh risk.  Electronic Signatures: Marcina MillardParaschos, Baylen Buckner (MD)  (Signed 25-Mar-14 12:53)  Authored: Brief Consult Note   Last Updated: 25-Mar-14 12:53 by Marcina MillardParaschos, Ellar Hakala (MD)

## 2014-07-23 NOTE — H&P (Signed)
Past Med/Surgical Hx:  A fib:   CVA/Stroke:   CHF:   Diabetes Mellitus, Type II (NIDD):   MI - Myocardial Infarct:   mastoidectomy:   ALLERGIES:  No Known Allergies:    Assessment/Admission Diagnosis 81 yo male with PMHx of AFib on coumadin, HTN, DM, now with left hip fracture  1. Left Hip fracture - accidental fall, left foot got caught on walker - will require surgical intervention - spoke with Dr. Claris Gladdenamasunder, requested medical and cardiac cleareance - plan to reverse INR <1.5, currently on coumadin - cardiology consult in the AM - will pain control with morphine at this time - other recommendation per orthopedics - NPO, IVF, ns@75cc \hr  2. Afib - current on coumadin, will plan to reverse for surgical intervention - cardiology consult in the AM, preop clearance - cont with rate control  3. HTN - cont with lisinopril and torsemide  4. DM - monitor FSBS and ssi  GI/DVT prophylaxis - PPI/teds   FULL CODE PMD - Dr.Fitzgerald  Time spent evaluating patient = 55 minutes [id number]#[id number]   Electronic Signatures: Stephanie AcreMungal, Bertrice Leder (MD)  (Signed 24-Mar-14 22:47)  Authored: PAST MEDICAL/SURGIAL HISTORY, ALLERGIES, ASSESSMENT AND PLAN   Last Updated: 24-Mar-14 22:47 by Stephanie AcreMungal, Vantasia Pinkney (MD)

## 2014-07-23 NOTE — Op Note (Signed)
PATIENT NAME:  Derek [name], Derek [name] MR#:  [id number] DATE OF BIRTH:  Jul 09, 1923  DATE OF PROCEDURE:  06/25/2012  SURGEON:  Murlean Hark, M.D.   ASSISTANT:  None.   PREOPERATIVE DIAGNOSIS:  Left hip displaced femoral neck fracture.   POSTOPERATIVE DIAGNOSIS:  Left hip displaced femoral neck fracture.  PROCEDURE:  Left hip hemiarthroplasty.   COMPONENTS USED:  DePuy Summit System Bipolar 12/14 taper cemented femoral stem size 5, cement restrictor size 3, self-centering bipolar head 28 mm inner diameter, 51 mm outer diameter, femoral head +8.5 28 mm 12/14 taper.   ESTIMATED BLOOD LOSS:  300 mL.   URINE OUTPUT:  135.   COMPLICATIONS:  No immediate intraoperative or postoperative complications were noted.   INDICATIONS FOR PROCEDURE:  The patient is an 81 year old gentleman who had fallen two days prior to admission.  He was brought in through the Emergency Room.  He was evaluated and found to have a displaced femoral neck fracture.  He was admitted to the medicine service due to a diagnosis of atrial fibrillation.  After medical clearance was obtained the patient was scheduled for surgical treatment of his hip fracture.   Derek [name] of the injury and recommended surgery were discussed with the patient and his wife.  All the risks and benefits were explained.  Left hip was marked for surgery.  Informed consent was obtained.  All questions are answered.   DESCRIPTION OF PROCEDURE:  The patient is brought down to the operating room.  Surgical site marking was confirmed.  Informed consent was reviewed.   The patient was brought into the operating room where spinal anesthesia and sedation were administered.  The patient was placed on the operating room table in a lateral decubitus position with the left side up.  The left hip was prepared and draped in the usual sterile fashion.  Surgical timeout was performed identifying the patient, procedure, imaging studies, operative extremity and laterality,  confirming site marking and administration of antibiotics, confirming drying of prep, and confirming that all sterile indicators were evaluated and sterile.   Anatomic landmarks were palpated.  An incision was made and sharp dissection was carried down to the tensor fascia.  The fascia was sharply split distally and bluntly split proximally to the gluteus.  Charnley retractor was inserted and the hip was internally rotated.  Bursa was removed.  Short external rotators were identified.  Using electrocautery, the piriformis was cut and tagged.  The gemelli were transected.  The capsule was significantly disrupted from injury, however the intact portion was tagged.  The femoral neck was identified and cleaned of soft tissue.  The fracture of the neck groove was quite comminuted and revealed a very low fracture.  There was one small sharp spike at the lesser trochanter with very little other remaining neck.  Neck cut was made to even out bone.  Femoral head was removed.  Acetabulum was cleaned of loose fragments.   Canal finder was inserted to find the femoral canal.  A lateralizer was used to ensure lateralization of the component.  Sequential broaching was undertaken from size 1 to size 5.  Size 5 was found to have a good fit.  Femoral head that was extracted measured 51 mm.  A 51 mm trial head was applied to the size 5 broach with a +1.5 insert.  This was reduced and found to be too loose.  A size +5 insert was then applied and stability was increased.  All trial components were now removed.  Cement restrictor was sized.  The canal was copiously irrigated.  Canal was packed with Neo-Synephrine soaked packing and anesthesia was made aware.   Packing was removed and the canal was dried.  Size 3 cement restrictor was inserted.  The canal was packed with pressurized cement.  A size 5 12/14 taper femoral stem with centralizer was inserted.  Version was assessed and stem was held in adequate version until cement was  completely dry.  All excess cement was removed.  Acetabulum was washed for any bits of residual cement.   Trial components were again applied and size +8.5 with a 51 mm outer diameter head was found to have excellent fit and stability.  Trial components were removed.  A size 28 inner diameter femoral head with a 28/51 mm bipolar component was inserted and impacted.  The hip was reduced.  It was found to be quite stable with equal leg lengths.   The wound was copiously irrigated.  A Hemovac drain was inserted.  The fascia was closed using 0 Vicryl suture.  Subcutaneous tissue was closed using 2-0 Vicryl suture and skin was closed using staples.  Sterile dressings were applied.  An abduction pillow was placed.   The patient will be weight-bearing as tolerated.  He will be placed on Lovenox and the medical team will be contacted to assess when to restart Coumadin.  He will have 24 hours of prophylactic antibiotics.  He will maintain hip precautions.       ____________________________ Murlean HarkShalini Zeya Balles, MD sr:ea D: 06/27/2012 15:11:37 ET T: 06/28/2012 04:14:53 ET JOB#: [id number]  cc: Murlean HarkShalini Sukhmani Fetherolf, MD, <Dictator> Murlean HarkSHALINI Tinna Kolker MD ELECTRONICALLY SIGNED 07/11/2012 15:20

## 2014-07-23 NOTE — Discharge Summary (Signed)
PATIENT NAME:  Derek [name], Real F MR#:  [id number] DATE OF BIRTH:  [birth year]  DATE OF ADMISSION:  06/23/2012 DATE OF DISCHARGE:  06/30/2012  ADMITTING DIAGNOSIS: Left subcapital femoral neck fracture.   DISCHARGE DIAGNOSIS: Left subcapital femoral neck fracture.   PROCEDURE:  On 06/25/2012 he had a left hip hemiarthroplasty.   SURGEON: Murlean HarkShalini Ramasunder, MD  ANESTHESIA: Spinal.   ESTIMATED BLOOD LOSS: 300 mL.   DRAINS: Hemovac.   IMPLANTS: DePuy Summit system bipolar, a 12/14 taper cemented femoral stem size 5, cement restrictor size 3, self-centering bipolar head 28 mm inner diameter x 51 mm outer diameter, femoral head +8.5, 28 mm, 12/14 taper.  COMPLICATIONS: None.  HISTORY: Derek [name] is an 81 year old male who fell on 06/21/2012.  He was brought to the Emergency Room on 06/23/2012 and was found to have a displaced femoral neck fracture. He was admitted to medicine service due to diagnosis of atrial fibrillation. After medical clearance was obtained he was scheduled for surgery and consented.   PHYSICAL EXAMINATION: GENERAL: Well-developed and well-nourished, in no acute respiratory distress. Moderate left hip pain.  HEENT: PERRLA, EOMI. No scleral icterus. No conjunctivitis. Positive difficulty hearing, especially in the left ear. TMs are intact and clear. No pharyngeal erythema. Mucous membranes are moist.  NECK: No thyroid enlargement or nodules. Neck is supple, nontender. No adenopathy. No JVD. No carotid bruits. Full range of motion is noted.  LUNGS:  Clear to auscultation bilaterally. No rales, rhonchi or crackles. No diminished breath sounds.  HEART:  Irregularly irregular. S1 and S2 auscultated. No murmurs. No lower extremity edema. Chest nontender. Good pedal pulses are noted.  ABDOMEN: Soft, nontender and nondistended. Positive bowel sounds.  MUSCULOSKELETAL: Severe left hip pain. Unable to assess muscle strength. Right lower extremity  4/5, bilateral upper extremities 4/5  with strength. SKIN: No rashes lesions, erythema or nodules. Warm and dry.  LYMPHATIC: No adenopathy noted in cervical, axilla or supraclavicular regions.  NEUROLOGIC:  Cranial nerves II through XII are intact. Deep tendon reflexes are intact. No aphasia or dysphagia.  PSYCHIATRIC: Alert and oriented to person, time and place. Cooperative. Good judgment. Memory is intact.   HOSPITAL COURSE: After initial admission on 06/23/2012, medicine consulted was obtained. He was not cleared for surgery until 06/25/2012. He underwent left hip hemiarthroplasty on that day. He had good pain control afterwards and was brought to the orthopedic floor from the PACU. His initial hemoglobin on postoperative day 1, 06/26/2012, was 12.0 and hematocrit 36.0. Physical therapy was begun on that day and he had slow progress. Postoperative day 2, 06/27/2012, hemoglobin was stable at 11.8. He had a T-max of 100.2 that day. On postoperative day 3, 06/28/2012, he continued to have slow progress with physical therapy and was constipated. On postoperative day 4, 06/29/2012, he continued to make slow progress with physical therapy. His labs were stable and he was afebrile. Vital signs were stable. On postoperative day 5, 06/30/2012, he is stable and ready for discharge.   CONDITION AT DISCHARGE: Stable.   DISPOSITION: The patient was sent to rehab.   DISCHARGE INSTRUCTIONS: 1.  The patient [facility], in [facility]opedics, in 2 weeks for staple removal.  2.  He will do physical therapy and may be weight-bearing as tolerated, on the left lower extremity.  3.  He will have a carbohydrate-controlled, 1800 calories.  4.  TED hose will be used knee-high bilaterally.  5.  Abduction pillow will be used when in the bed and in the  chair.  6.  Dressing of the left hip can be changed once daily on an as-needed basis.    DISCHARGE MEDICATIONS: 1.  Warfarin 5 mg 1 tab p.o. once daily in the evening.  2.  Klor-Con 10  milliequivalents oral tablet extended release 2 tabs p.o. twice daily.  3.  Lyrica 150 mg oral capsule 1 cap p.o. 2 times daily.  4.  Lisinopril 10 mg 1 tab p.o. once daily.  5.  Multivitamin 1 tab p.o. once daily.  6.  Torsemide 10 mg oral tablet 1 tab p.o. once daily.  7.  Aspirin low dose 81 mg oral delayed release tablet 1 tab p.o. once daily.  8.  Magnesium oxide 400 mg oral tablet 1 tab p.o. once daily.  9.  Acetaminophen/hydrocodone 325/5 mg 1 tab p.o. q. 4 to 6 hours p.r.n. pain.  10.  Acetaminophen 325 mg tablet 2 tabs p.o. q. 4 hours p.r.n. temperature greater than 100.5.  11.  Magnesium hydroxide 8% 30 mL p.o. once daily at bedtime p.r.n. constipation.  12.  Enoxaparin 40 mg injection sub-Q once daily x 2 weeks.  13.  Promethazine 12.5 mg 1 tab p.o. q. 6 hours p.r.n. nausea or vomiting.  14.  Zofran 4 mg p.o. 1 tab p.o. q. 8 hours p.r.n. nausea or vomiting.  15.  Metoprolol 25 mg extended release tablet 1 tab p.o. once daily.  16.  Ferrous sulfate 325 oral tablet 1 tab p.o. b.i.d. with meals. 17.  Senna 1 tab p.o. twice daily p.r.n. constipation.  18.  Bisacodyl 10 mg rectal suppository 1 suppository rectal once daily at bedtime p.r.n. constipation.  19.  Docusate sodium 100 mg oral capsule 1 cap p.o. b.i.d. p.r.n. constipation.  20.  Pantoprazole 40 mg delayed release tablet 1 tab p.o. once daily.  21.  Calcium/vitamin D 500/200 mg 1 tab p.o. b.i.d. with meals.   NOTE:  Medicine will also advise on anticoagulation plan as he is already on Coumadin and Lovenox. This is subject to change.  ADDENDUM: Medicine wanted him discharged on Lovenox only. The discharging nurse was notified of this and updated his discharge instructions for his rehab facility.  ____________________________ Perlita Forbush M. Haskel Khan, NP amb:sb D: 06/30/2012 07:47:22 ET T: 06/30/2012 08:31:17 ET JOB#: [id number]  cc: [place] Luddy M. Haskel Khan, NP, <Dictator> Burt Ek Nikaya Nasby FNP ELECTRONICALLY SIGNED 07/03/2012 10:13

## 2014-07-23 NOTE — Consult Note (Signed)
PATIENT NAME:  [name], [name] MR#:  [id number] DATE OF BIRTH:  30-Jan-1924  DATE OF CONSULTATION:  06/24/2012  REFERRING PHYSICIAN:   CONSLeavy CellaULTING PHYSICIAN:  Derek MillardAlexander Robinn Overholt, MD  PRIMARY CARE PHYSICIAN: Derek Braunavid Fitzgerald, MD  CHIEF COMPLAINT: "I fell."   REASON FOR CONSULTATION:  Requested for preoperative cardiovascular evaluation prior to hip surgery.   HISTORY OF PRESENT ILLNESS:  The patient is an 81 year old gentleman with prior history of coronary artery disease and atrial fibrillation, followed by Dr. Lady [name]. He was in his usual state of health until 06/23/2012 when he fell and hurt his left hip. The patient was [facility] ED via EMS and was found to have a left hip fracture. Preoperative cardiovascular assessment is requested prior to surgery. The patient denies chest pain. He reports he has had a previous history of myocardial infarction and CVA without history of congestive heart failure, diabetes or chronic kidney disease.   PAST MEDICAL HISTORY: 1.  Remote history of myocardial infarction.  2.  Status post CVA with residual left-sided weakness.  3.  Atrial fibrillation.  4.  Diabetes.   MEDICATIONS: 1.  Coumadin 5 mg daily.  2.  Aspirin 81 mg daily.  3.  Klor-Con 10 milliequivalents 2 tabs b.i.d.  4.  Lisinopril 10 mg daily.  5.  Torsemide 10 mg daily.  6.  Lyrica 150 mg b.i.d.  7.  Magnesium oxide 1 daily.  8.  Multivite 1 daily.   SOCIAL HISTORY: The patient is married. He denies tobacco abuse.   FAMILY HISTORY: Father died at age 79 with history of congestive heart failure.  REVIEW OF SYSTEMS:   CONSTITUTIONAL: [name] fever or chills.  EYES: [name] blurry vision.  EARS: [name] hearing loss.  RESPIRATORY: [name] shortness of breath.  CARDIOVASCULAR: [name] chest pain.  GASTROENTEROLOGY: The patient reports he has had some mild nausea and midepigastric discomfort due to reflux disease.  GENITOURINARY: [name] dysuria or hematuria.  ENDOCRINE: [name] polyuria or polydipsia.   HEMATOLOGICAL: The patient does have easy bruising, currently on Coumadin.  INTEGUMENTARY: [name] rash.  MUSCULOSKELETAL: The patient has a left hip fracture.  NEUROLOGIC: The patient has some mild left-sided weakness due to prior CVAs. PSYCHIATRIC: [name] depression or anxiety.   PHYSICAL EXAMINATION: VITAL SIGNS: Blood pressure 155/82, pulse 83, respirations 18, temperature 98.2, and pulse ox 93%.  HEENT: Pupils equal and reactive to light and accommodation.  NECK: Supple without thyromegaly.  LUNGS: Clear.  HEART: Normal JVP. Normal PMI. Irregularly irregular rhythm. Normal S1, S2. [name] appreciable gallop, murmur or rub.  ABDOMEN: Soft and nontender. Pulses were intact bilaterally.  MUSCULOSKELETAL: Normal muscle tone.  NEUROLOGIC: The patient is alert and oriented x 3. Motor and sensory are both grossly intact.   IMPRESSION: An 81 year old gentleman with remote history of myocardial infarction, prior cerebrovascular accident and atrial fibrillation, currently on Coumadin. INR has been normalized. The patient would be at low to moderate risk for serious cardiovascular complication during left hip surgery.   RECOMMENDATIONS:  1.  Proceed with surgery since benefit at this time appears to outweigh risk.   2.  Would defer further cardiac diagnostics prior to surgery.  3.  Add low dose of beta blocker for rate control peri and postoperatively. ____________________________ Derek MillardAlexander Eltha Tingley, MD ap:sb D: 06/24/2012 12:50:45 ET T: 06/24/2012 13:08:46 ET JOB#: [id number]  cc: Derek MillardAlexander Waylynn Benefiel, MD, <Dictator> Derek MillardALEXANDER Zilda No MD ELECTRONICALLY SIGNED 07/08/2012 12:38

## 2014-07-24 NOTE — Consult Note (Signed)
Brief Consult Note: Diagnosis: Left femur fracture.   Patient was seen by consultant.   Recommend to proceed with surgery or procedure.   Recommend further assessment or treatment.   Orders entered.   Discussed with Attending MD.   Comments: 81 year old male fell on garage steps at home today injuring the left leg.  Brought to Emergency Room where exam and X-rays show a displaced spiral fracture of the left distal femur below a hemiarthroplasty.  He is being admitted for medical evaluation and surgery.  He is on coumadin for Atrial fibrillation which will require reversal.    Exam:  Alert and oriented.  circulation/sensation/motor function good distally left leg.  Left distal thigh swollen and rotated.  Skin intact.  Pain with range of motion.  Cut on chin and skin avulsion left hand.    X-rays:  as above  Imp: Left distal femur fracture with displacement on coumadin\  Rx:  open reduction and internal fixation left femur when cleared medically and INR reversed to normal.  Electronic Signatures: Valinda HoarMiller, Howard E (MD)  (Signed 17-Jun-15 17:51)  Authored: Brief Consult Note   Last Updated: 17-Jun-15 17:51 by Valinda HoarMiller, Howard E (MD)

## 2014-07-24 NOTE — Op Note (Signed)
PATIENT NAME:  Derek CellaHARRIS, Niall F MR#:  [id number] DATE OF BIRTH:  1923-08-31  DATE OF PROCEDURE:  09/18/2013  PREOPERATIVE DIAGNOSIS: Displaced shortened spiral fracture left distal femur.   POSTOPERATIVE DIAGNOSIS: Displaced shortened spiral fracture left distal femur.   PROCEDURE PERFORMED: Open reduction internal fixation left femur fracture with a Synthes 10-hole condylar plate and multiple screws.   SURGEON: Dr. Hyacinth MeekerMiller.   ASSISTANT: Janell QuietLance McGee, PA student.   ANESTHESIA: Spinal.   COMPLICATIONS: None.   DRAINS: 1 Hemovac.   ESTIMATED BLOOD LOSS: 400 mL.  REPLACED: None.   DESCRIPTION OF PROCEDURE: The patient was brought to the operating room where he underwent satisfactory spinal anesthesia. He was placed in the supine position on the operating room table. A was slight bump was placed under the left hip and the left leg was then prepped and draped in a sterile fashion. A longitudinal lateral incision was made starting from mid thigh distally across the knee joint. Dissection was carried out sharply through the subcutaneous tissue and fascia. The vastus musculature was elevated bluntly anteriorly exposing the lateral shaft of the femur. The wound was irrigated and blood and clot removed. Traction on the leg showed that the fracture had shortened significantly. Aggressive traction was applied while the leg was on a triangle wedge but I could not get the length back completely. Two stab wounds were made in the thigh and Shantz pins placed in the proximal and distal of the fracture, and a femoral distractor was applied. The leg was pulled out to length and the distractor was tightened. By gradually tightening the distractor, I was able to get the bone back out to length. The fracture was aligned and clamped. The femoral distractor was then removed. A 4.5 cortical screw was placed lateral to medial to  fix the fracture temporarily. A 10-hole 4.5-mm Synthes condylar plate was then contoured to  fit the shaft of the femur. The plate was affixed to the lateral condyle distally and then clamped proximally. The mediolateral screw was removed. The fracture was held reduced with a turkey-claw clamp. Multiple cortical and locking screws were then inserted to hold the fracture and stabilize it securely. There was no significant shortening and rotation was almost anatomic. The fracture was quite stable. Fluoroscopy was done throughout the procedure to verify the positioning of the plate and screws and fracture fragments. Once this was completed, the wound was irrigated. The vastus muscle was placed back over the lateral side of the femur and a single  Hemovac placed in the wound. The fascia was closed with zero Quill suture. The skin was closed with staples. Dry sterile dressing was applied. The tourniquet was not used. A knee immobilizer was reapplied and Hemovac wasconnected to suction. The patient was transferred to his hospital bed and taken to recovery in good condition.    ____________________________ Valinda HoarHoward E. Miller, MD hem:lt D: 09/18/2013 18:50:17 ET T: 09/19/2013 00:52:57 ET JOB#: [id number]  cc: Valinda HoarHoward E. Miller, MD, <Dictator> Valinda HoarHOWARD E MILLER MD ELECTRONICALLY SIGNED 09/19/2013 11:22

## 2014-07-24 NOTE — Consult Note (Signed)
PATIENT NAME:  Derek [name], [name] MR#:  [id number] DATE OF BIRTH:  09-Oct-1923  DATE OF CONSULTATION:  09/16/2013  REFERRING PHYSICIAN:  Valinda HoarHoward E. Miller, MD CONSULTING PHYSICIAN:  Marya AmslerMarshall W. Dareen PianoAnderson, MD  HISTORY OF PRESENT ILLNESS:  Mr. Derek [name] is a pleasant, elderly Caucasian gentleman who slipped in his garage today and suffered a fibula fracture. History of falling and having a hip fracture about a year ago per the patient. His pain is now controlled with analgesia. He has denied having chest pain or shortness of breath throughout this time. He does have a history of atrial fibrillation, on warfarin. He has been taking some vitamin K IV presently to reverse his warfarin. I was consulted for further medical evaluation and treatment. His INR was 2.3 earlier today prior to that.   REVIEW OF SYSTEMS: Negative other than above.   PAST MEDICAL HISTORY: 1.  Atrial fibrillation.  2.  Cardiomyopathy. No recent outpatient echocardiogram that I could find.  3.  Hypertension.  4.  History of stroke with residual left hemiparesis, mainly in the arm.  5.  Benign prostatic hypertrophy.   PAST SURGICAL HISTORY: Surgery of the left ear, skin cancer; prostate surgery, left hip procedure.   ALLERGIES: No known drug allergies.   FAMILY HISTORY: Brother deceased but had a pacemaker. Mother with lung cancer.   SOCIAL HISTORY: He is married, 2 kids, retired Scientist, research (physical sciences)real estate broker. Moderate alcohol. Minimal if any tobacco.   DISCHARGE MEDICATIONS: Per [facility] med reconciliation: Warfarin 5 mg a day, torsemide 10 mg a day, senna as needed, Phenergan as needed, pantoprazole 40 mg daily, a multivitamin daily, metoprolol 25 mg daily, Lyrica 150 mg b.i.d., lisinopril 10 mg daily, potassium 10 mEq daily, aspirin 81 mg daily.   PHYSICAL EXAMINATION: GENERAL: Pleasant, somewhat confused from analgesia, lying on a gurney in no acute distress.  VITAL SIGNS: Pulse 72 and regular, respirations 16, afebrile.  HEENT:  Normocephalic, atraumatic. Sclera clear.  NECK: No bruits, thyromegaly, adenopathy or JVD.  CHEST: Clear to auscultation and percussion on inspection.  HEART: Regular rate, no murmurs or gallops.  ABDOMEN: Soft, flat, nontender. No hepatosplenomegaly, no bruits, masses, etc.  EXTREMITIES: No clubbing, cyanosis or edema.  NEUROLOGIC: Mild left arm weakness only.  SKIN: No lesions noted other than several small lacerations from his fall.   LABORATORY, DIAGNOSTIC AND RADIOLOGICAL DATA:  Normal urinalysis. CBC with hemoglobin 14.3. INR as noted. Creatinine 0.93. Head CT on admission was done after his fall for which the impression showed moderate diffuse atrophy, small vessel disease and history of lacunar infarcts, stable temporal bone, chronic erosive changes, chronic mastoid disease which seems stable. Left femur showed spiral fracture of the distal femoral shaft extending to the distal metaphysis, moderately displaced. Chest x-ray no edema or consolidation.   ASSESSMENT AND PLAN:  1.  Atrial fibrillation. Agree with reversal of the warfarin. Will then need deep venous thrombosis prophylactic fluids once that is improved. Surgery can follow this. Medicine will follow this along as well.  2.  Hypertension, stable on medications.  3.  Coronary artery disease, stable at this point. No changes. Will follow along.  4.  Fracture with history of multiple other fractures. Will need a good dose of vitamin D daily. Consideration for further workup down the line.   ____________________________ Marya AmslerMarshall W. Dareen PianoAnderson, MD mwa:cs D: 09/16/2013 19:40:07 ET T: 09/16/2013 20:21:50 ET JOB#: [id number]  cc: Marya AmslerMarshall W. Dareen PianoAnderson, MD, <Dictator> Lauro RegulusMARSHALL W ANDERSON MD ELECTRONICALLY SIGNED 09/17/2013 16:17

## 2014-07-24 NOTE — Discharge Summary (Signed)
PATIENT NAME:  Derek [name], Derek [name] MR#:  [id number] DATE OF BIRTH:  1924-03-17  DATE OF ADMISSION:  09/16/2013 DATE OF DISCHARGE:  09/22/2013  FINAL DIAGNOSES: Comminuted displaced left distal femoral shaft fracture, atrial fibrillation, cardiomyopathy, hypertension, history of stroke with some left hemiparesis, and benign prostatic hypertrophy.   OPERATIONS: On 09/18/2013, open reduction internal fixation, left femoral shaft fracture with a Synthes condylar plate.   COMPLICATIONS: None.   CONSULTATIONS: Dr. Einar CrowMarshall Anderson.   DISCHARGE MEDICATIONS: Norco 5/325 p.r.n. pain, Fosamax 70 mg weekly, Vitamin D3 2000 units daily, iron 1 p.o. daily, lisinopril 10 mg daily, metoprolol 25 mg daily, Protonix 40 mg daily, Lyrica 150 mg b.i.d., Demadex 10 mg daily, Coumadin 5 mg daily, and potassium chloride 20 mEq daily.   HISTORY OF PRESENT ILLNESS: The patient is an 81 year old male who fell in his garage trying to go up the concrete steps and landed, injuring his left leg. He was brought to the Emergency Room where exam and x-ray showed a displaced spiral fracture of the left distal femur. He was admitted for stabilization and operative fixation. He was on Coumadin which had to be reversed. The patient was seen and cleared for surgery by the medical service.   PAST MEDICAL HISTORY: As above.   MEDICATIONS: As above.   ALLERGIES: NONE.  REVIEW OF SYSTEMS: Unremarkable.   FAMILY HISTORY: Unremarkable.   SOCIAL HISTORY: The patient lives at home with his wife.   PHYSICAL EXAMINATION: On admission, the patient is alert and cooperative. Left leg was short and rotated. Neurovascular status was intact distally. There was pain with movement. No other injuries were noted.   HOSPITAL COURSE: The patient received IV AquaMEPHYTON and the pro time was reversed. He was taken to surgery 09/18/2013, where he underwent open reduction internal fixation of the left femoral shaft fracture. Postoperatively, he did  well. Hemoglobin dropped to an 8.7 level and remained stable after that. He was very slow to mobilize.  He was out of bed in a chair and, because of the femur fracture, physical therapy was limited to toe-touch weight-bearing only. He is stable and ready for skilled nursing discharge on 09/22/2013. He will be seen in my office in 2 weeks for exam. He is to remain touchdown weight-bearing only. He will have weekly Pro times. His current INR is 2.2 on 5 mg of Coumadin today.     ____________________________ Derek [name], [name] hem:ts D: 09/22/2013 13:04:17 ET T: 09/22/2013 13:48:46 ET JOB#: [id number]  cc: Derek [name], [name], <Dictator> Derek [name], [name] Derek [name] ELECTRONICALLY SIGNED 09/22/2013 15:59

## 2014-09-04 IMAGING — CR PELVIS - 1-2 VIEW
1 series · 1 of 1 positions shown · non-contrast
Comparison: June 25, 2012

CLINICAL DATA: Preoperative evaluation

EXAM:
PELVIS - 1-2 VIEW

[pelvis ap]
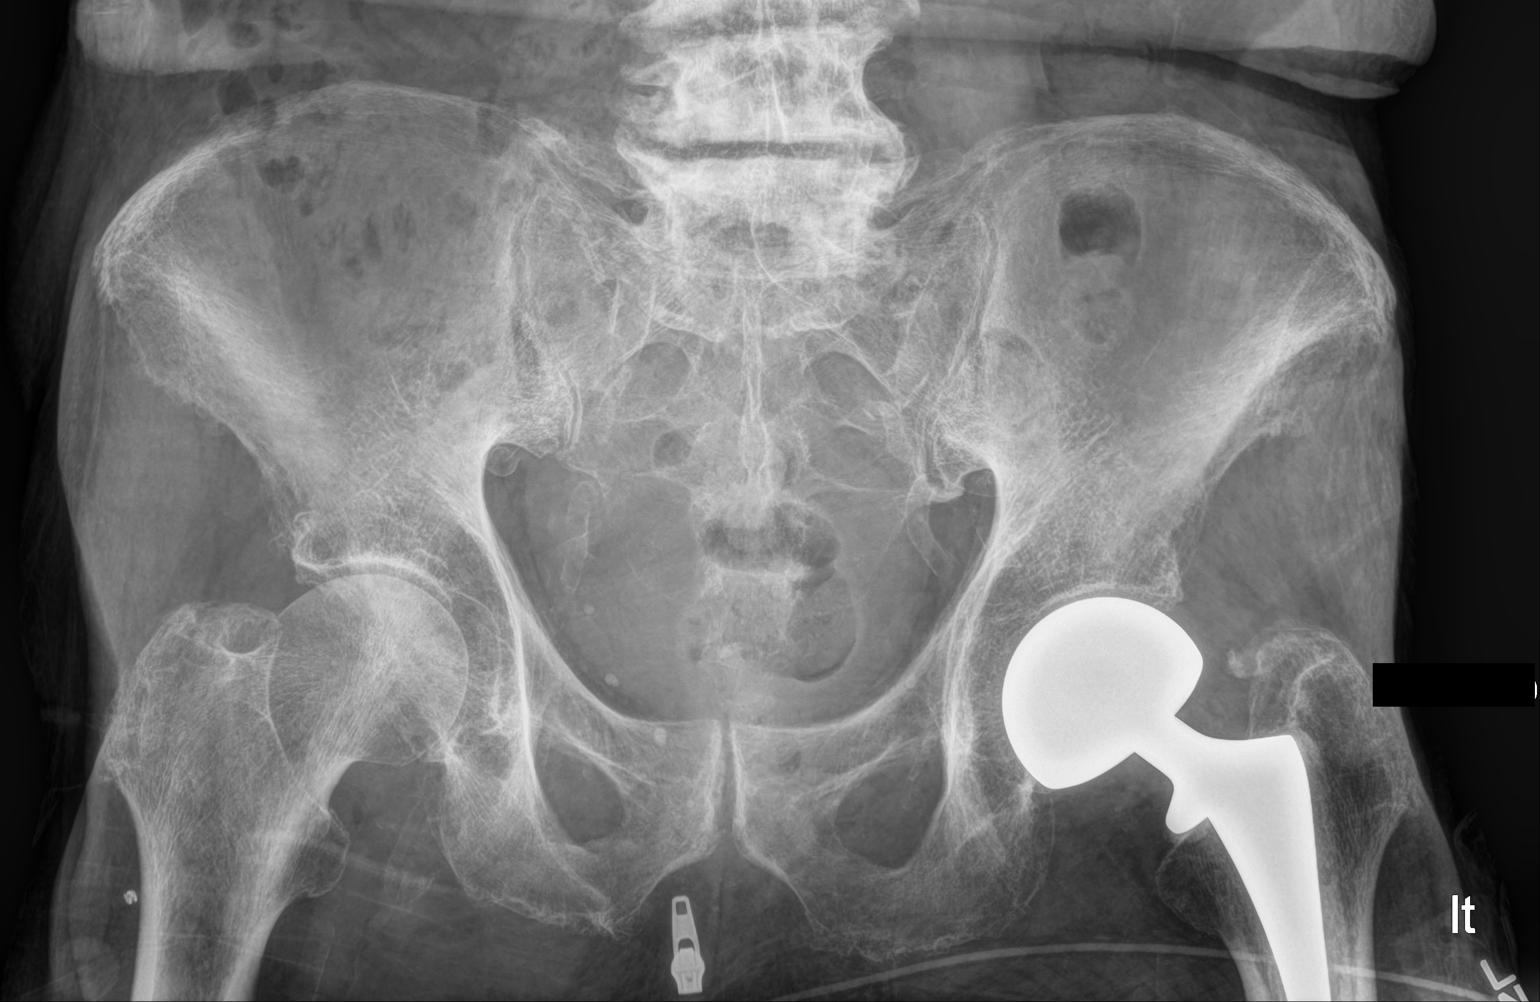

[1 of 1 positions shown; findings below may reference images not displayed]

FINDINGS: There is a total hip prosthesis on the left. Bones are diffusely
osteoporotic. There is no acute fracture or dislocation. There is
moderate narrowing of the right hip joint. There is degenerative
change in the visualized lumbar spine.
IMPRESSION: The bones are diffusely osteoporotic. Moderate osteoarthritic change
is noted in the visualized lumbar spine and right hip regions. There
is a total hip prosthesis on the left. There is no demonstrable
acute fracture or dislocation.

## 2014-09-04 IMAGING — CT CT HEAD WITHOUT CONTRAST
1 series · 15 of 30 positions shown, 19 images · non-contrast
Comparison: March 09, 2010

CLINICAL DATA: Pain post trauma

EXAM:
CT HEAD WITHOUT CONTRAST
TECHNIQUE: Contiguous axial images were obtained from the base of the skull
through the vertex without intravenous contrast.

[Series 2: head wo · axial · 0.46mm/px · z∈[+359,+514]mm · 15 of 36 slices shown, 19 images]
[im 2/36  brain]
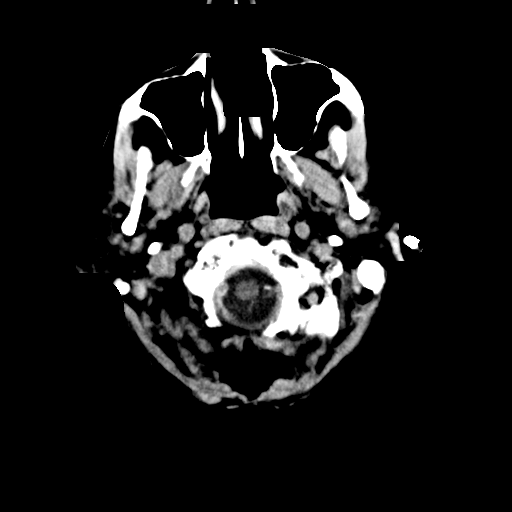
[im 2/36  bone]
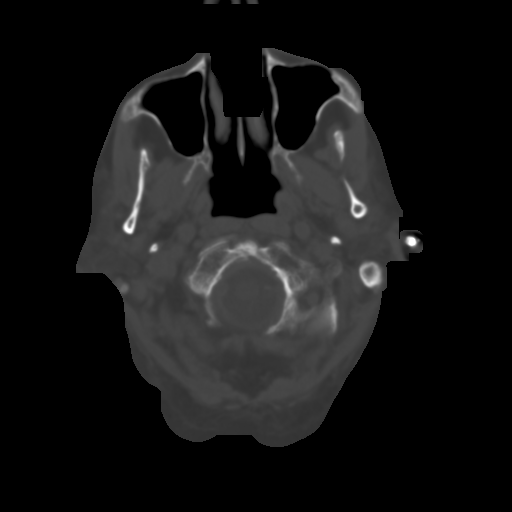
[im 4/36  brain]
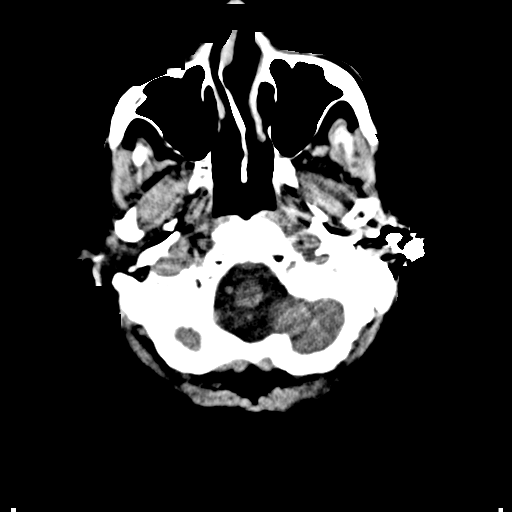
[im 7/36  brain]
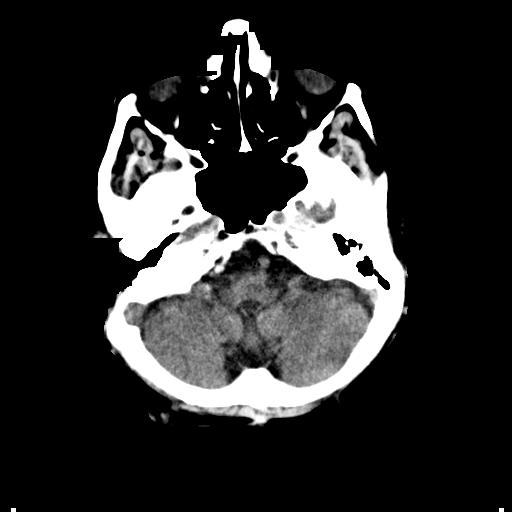
[im 9/36  brain]
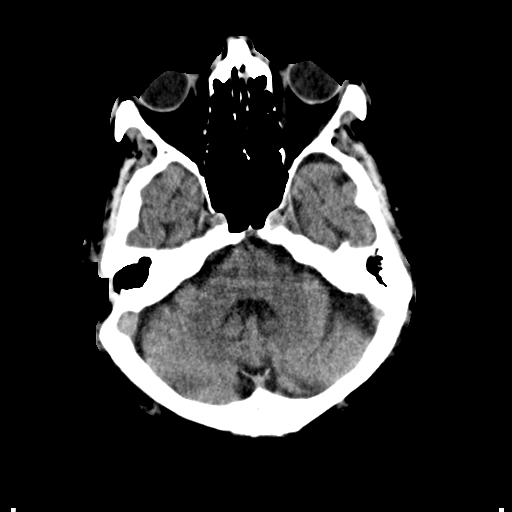
[im 11/36  brain]
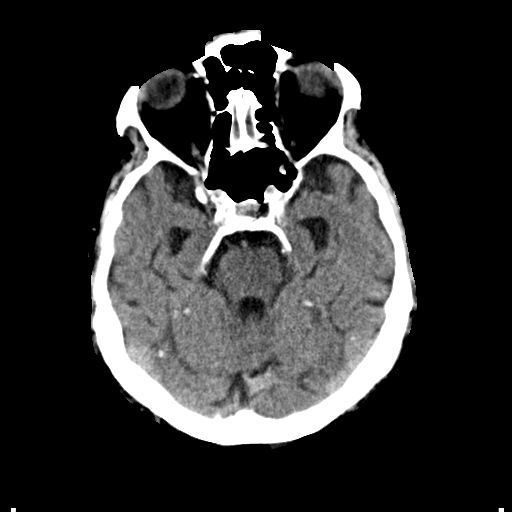
[im 11/36  bone]
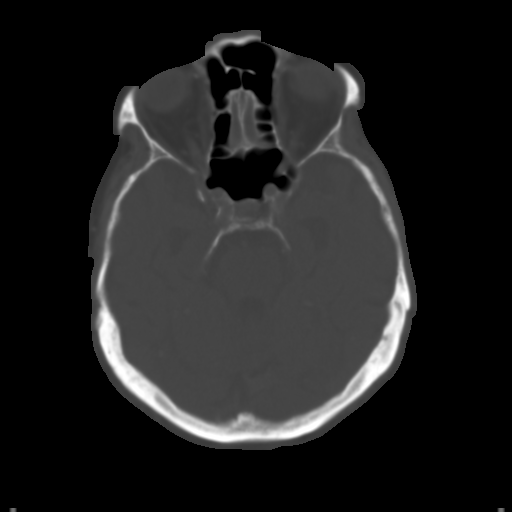
[im 14/36  brain]
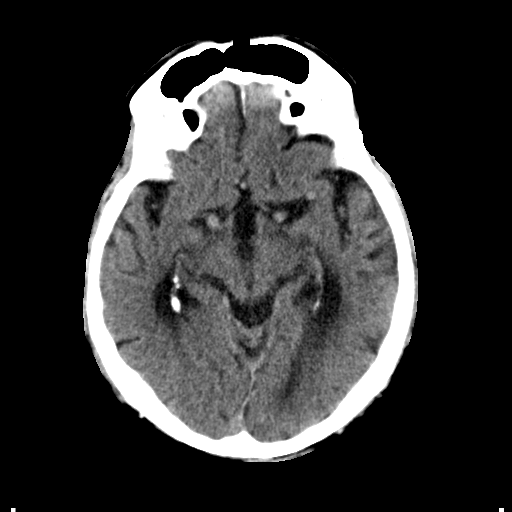
[im 16/36  brain]
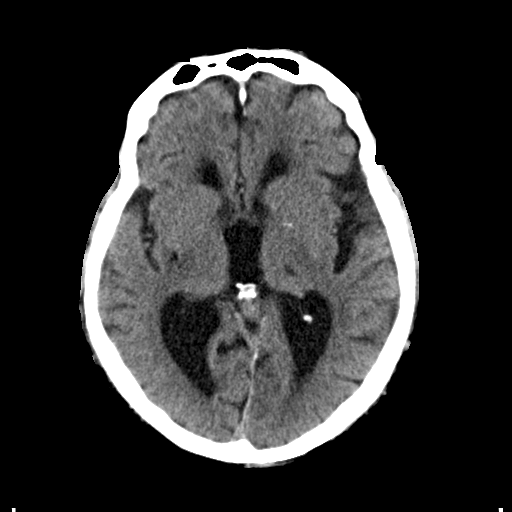
[im 19/36  brain]
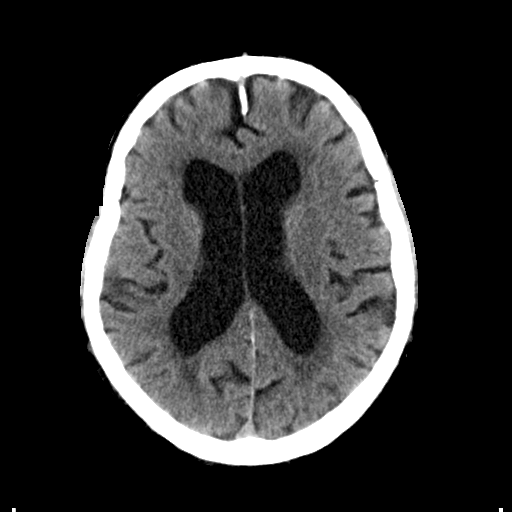
[im 20/36  brain]
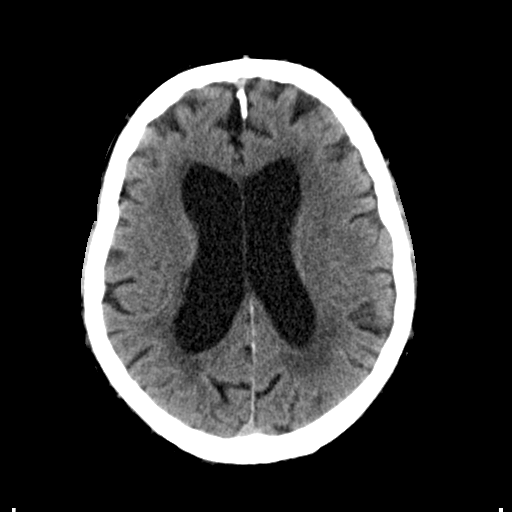
[im 20/36  bone]
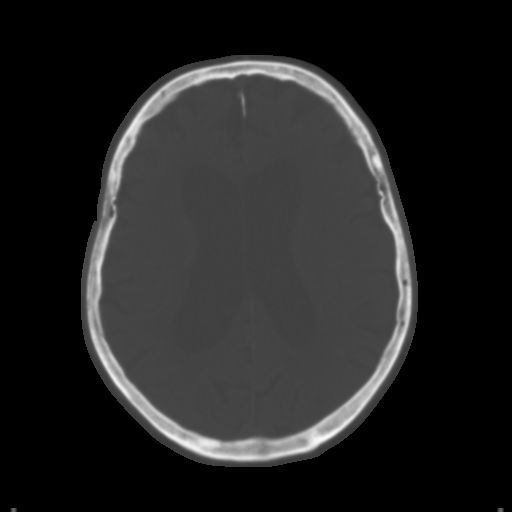
[im 22/36  brain]
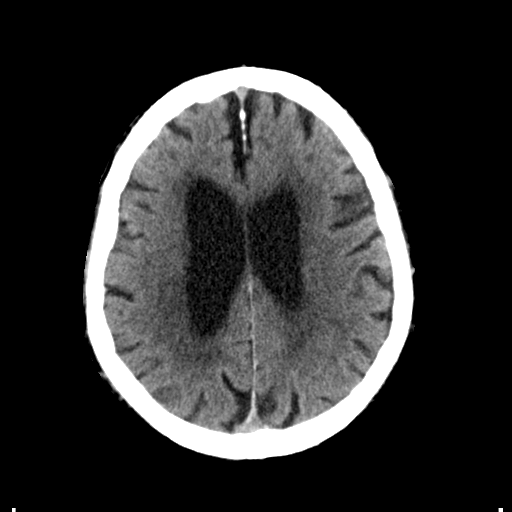
[im 25/36  brain]
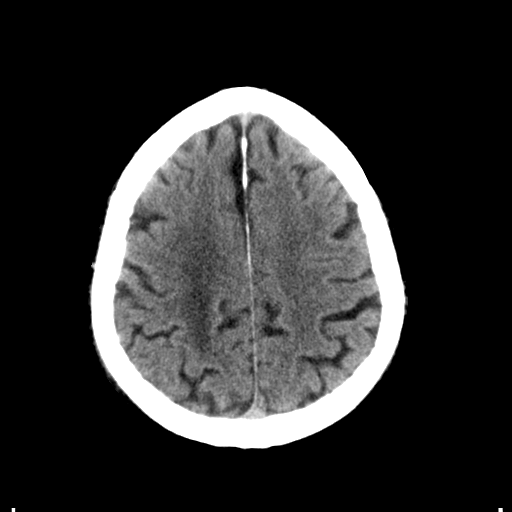
[im 27/36  brain]
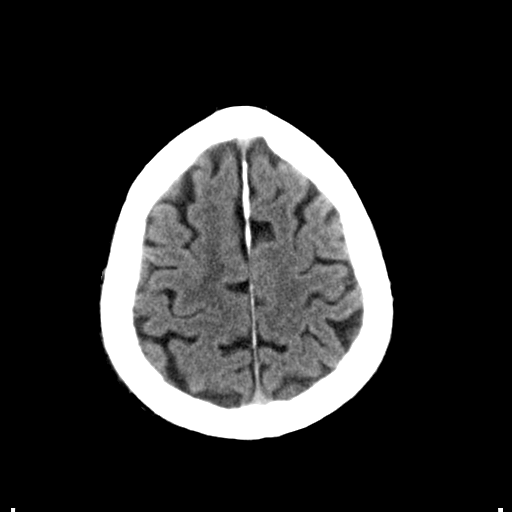
[im 29/36  brain]
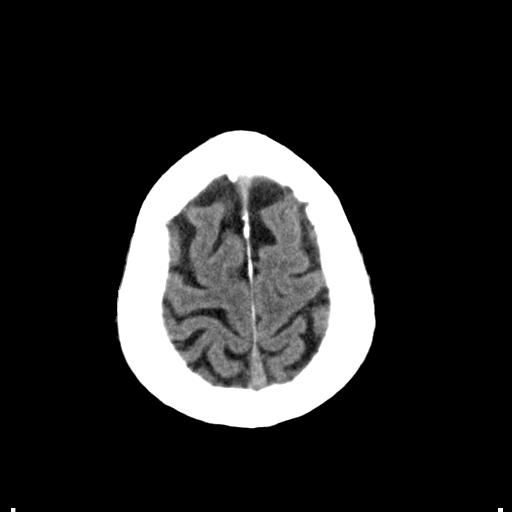
[im 29/36  bone]
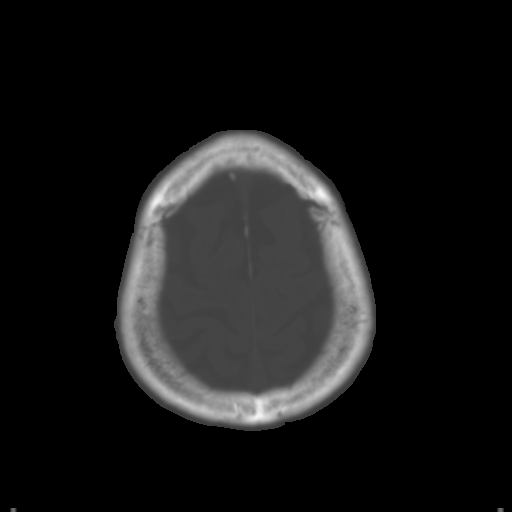
[im 32/36  brain]
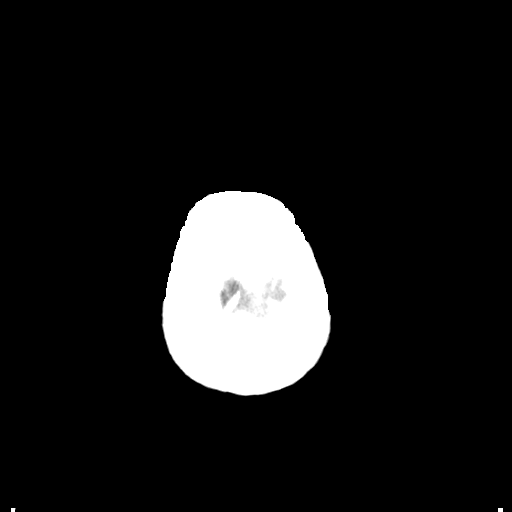
[im 34/36  brain]
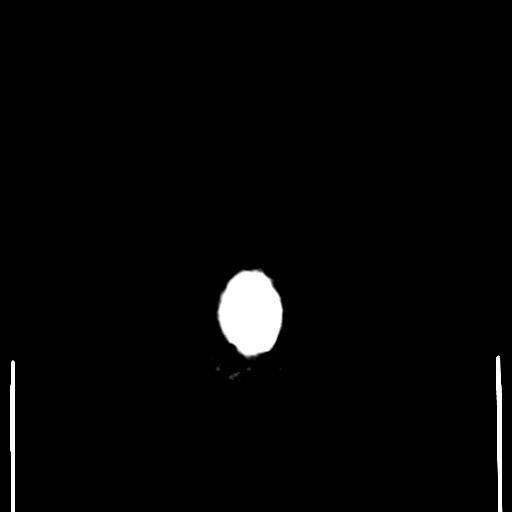

[15 of 30 positions shown; findings below may reference images not displayed]

FINDINGS: Moderate diffuse atrophy is stable. There is no appreciable mass,
hemorrhage, extra-axial fluid collection, or midline shift. There is
patchy small vessel disease in the centra semiovale bilaterally.
There is evidence of a prior small lacunar type infarct in the left
thalamus. There is also evidence of a prior small infarct in the
posterior limb of the right external capsule. There is no acute
appearing infarct.

There are no acute fractures in the bony calvarium. There is
evidence of chronic erosion along the inferior right temporal bone
which appear stable. There is opacification of mastoid air cells
bilaterally, more severe on the right than on the left, stable.
There is a small retention cyst in the posterior left maxillary
antrum.
IMPRESSION: Moderate diffuse atrophy with stable supratentorial small vessel
disease. Prior lacunar type infarcts in the left thalamus and
posterior limb the right external capsule. There is no intracranial
mass, hemorrhage, or acute appearing infarct. No extra-axial fluid
collections are identified. There is stable calcification in the
falx.

There is evidence of chronic erosive change along the inferior
temporal bone on the right, inferior to the external auditory canal
on the right. This is a stable finding. There is chronic mastoid
disease bilaterally which is likewise stable.
